# Patient Record
Sex: Male | Born: 1940 | Race: White | Hispanic: No | Smoking: Heavy tobacco smoker
Health system: Southern US, Community
[De-identification: ages and names within clinical notes are randomized; demographics above are authoritative.]

## PROBLEM LIST (undated history)

## (undated) DIAGNOSIS — M79671 Pain in right foot: Secondary | ICD-10-CM

## (undated) DIAGNOSIS — I4891 Unspecified atrial fibrillation: Secondary | ICD-10-CM

## (undated) DIAGNOSIS — R0602 Shortness of breath: Secondary | ICD-10-CM

## (undated) DIAGNOSIS — R29898 Other symptoms and signs involving the musculoskeletal system: Secondary | ICD-10-CM

## (undated) DIAGNOSIS — M79661 Pain in right lower leg: Secondary | ICD-10-CM

## (undated) DIAGNOSIS — L97919 Non-pressure chronic ulcer of unspecified part of right lower leg with unspecified severity: Secondary | ICD-10-CM

## (undated) DIAGNOSIS — M79652 Pain in left thigh: Secondary | ICD-10-CM

## (undated) DIAGNOSIS — M79662 Pain in left lower leg: Secondary | ICD-10-CM

## (undated) DIAGNOSIS — I2510 Atherosclerotic heart disease of native coronary artery without angina pectoris: Secondary | ICD-10-CM

## (undated) DIAGNOSIS — M79651 Pain in right thigh: Secondary | ICD-10-CM

## (undated) DIAGNOSIS — M25559 Pain in unspecified hip: Secondary | ICD-10-CM

## (undated) DIAGNOSIS — I7090 Unspecified atherosclerosis: Secondary | ICD-10-CM

## (undated) DIAGNOSIS — L97929 Non-pressure chronic ulcer of unspecified part of left lower leg with unspecified severity: Secondary | ICD-10-CM

## (undated) DIAGNOSIS — M79672 Pain in left foot: Secondary | ICD-10-CM

## (undated) HISTORY — DX: Pain in right thigh: M79.651

## (undated) HISTORY — DX: Pain in unspecified hip: M25.559

## (undated) HISTORY — DX: Unspecified atherosclerosis: I70.90

## (undated) HISTORY — DX: Pain in left lower leg: M79.662

## (undated) HISTORY — DX: Pain in right foot: M79.671

## (undated) HISTORY — DX: Pain in left thigh: M79.652

## (undated) HISTORY — DX: Non-pressure chronic ulcer of unspecified part of left lower leg with unspecified severity: L97.929

## (undated) HISTORY — DX: Pain in right lower leg: M79.661

## (undated) HISTORY — DX: Other symptoms and signs involving the musculoskeletal system: R29.898

## (undated) HISTORY — DX: Atherosclerotic heart disease of native coronary artery without angina pectoris: I25.10

## (undated) HISTORY — DX: Shortness of breath: R06.02

## (undated) HISTORY — DX: Unspecified atrial fibrillation: I48.91

## (undated) HISTORY — DX: Non-pressure chronic ulcer of unspecified part of right lower leg with unspecified severity: L97.919

## (undated) HISTORY — DX: Pain in left foot: M79.672

---

## 2010-10-20 ENCOUNTER — Telehealth (INDEPENDENT_AMBULATORY_CARE_PROVIDER_SITE_OTHER): Payer: Self-pay | Admitting: *Deleted

## 2010-10-23 ENCOUNTER — Encounter (INDEPENDENT_AMBULATORY_CARE_PROVIDER_SITE_OTHER): Payer: Self-pay | Admitting: *Deleted

## 2010-10-23 ENCOUNTER — Ambulatory Visit: Payer: Self-pay | Admitting: Physician Assistant

## 2010-10-23 DIAGNOSIS — K831 Obstruction of bile duct: Secondary | ICD-10-CM | POA: Insufficient documentation

## 2010-10-24 ENCOUNTER — Encounter: Payer: Self-pay | Admitting: Gastroenterology

## 2010-10-24 ENCOUNTER — Other Ambulatory Visit: Payer: No Typology Code available for payment source | Admitting: Gastroenterology

## 2010-10-24 ENCOUNTER — Ambulatory Visit (HOSPITAL_COMMUNITY): Payer: Medicare Other

## 2010-10-24 ENCOUNTER — Ambulatory Visit (HOSPITAL_COMMUNITY)
Admission: RE | Admit: 2010-10-24 | Discharge: 2010-10-24 | Disposition: A | Discharge status: Home / Self Care | Payer: Medicare Other | Source: Ambulatory Visit | Attending: Gastroenterology | Admitting: Gastroenterology

## 2010-10-24 DIAGNOSIS — I10 Essential (primary) hypertension: Secondary | ICD-10-CM | POA: Insufficient documentation

## 2010-10-24 DIAGNOSIS — K838 Other specified diseases of biliary tract: Secondary | ICD-10-CM | POA: Insufficient documentation

## 2010-10-24 DIAGNOSIS — Y836 Removal of other organ (partial) (total) as the cause of abnormal reaction of the patient, or of later complication, without mention of misadventure at the time of the procedure: Secondary | ICD-10-CM | POA: Insufficient documentation

## 2010-10-24 DIAGNOSIS — R932 Abnormal findings on diagnostic imaging of liver and biliary tract: Secondary | ICD-10-CM

## 2010-10-24 DIAGNOSIS — K929 Disease of digestive system, unspecified: Secondary | ICD-10-CM | POA: Insufficient documentation

## 2010-10-24 DIAGNOSIS — K832 Perforation of bile duct: Secondary | ICD-10-CM

## 2010-11-02 NOTE — Progress Notes (Signed)
Summary: ERCP  Phone Note Outgoing Call   Call placed by: Chales Abrahams CMA Duncan Dull),  October 20, 2010 4:22 PM Summary of Call: pt scheduled for ERCP 10/24/10 unable to reach ENDO I will call on Monday and schedule.  Needs propofol.   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

## 2010-11-02 NOTE — Procedures (Signed)
Summary: ERCP  Patient: Ovadia Lopp Note: All result statuses are Final unless otherwise noted.  Tests: (1) ERCP (ERC)   ERC ERCP                  DONE     [facility]     [address] [place], [place]  [address]          ERCP PROCEDURE REPORT          PATIENT:  Melton, Walls  MR#:  [id number]     BIRTHDATE:  [birth year]  GENDER:  male     ENDOSCOPIST:  Rachael Fee, MD     PROCEDURE DATE:  10/24/2010     Referral MD:  Anson Oregon, MD at [facility]     PROCEDURE:  ERCP with sphincterotomy     INDICATIONS:  bile duct leak following lap chole last week     MEDICATIONS:   MAC sedation, administered by CRNA     TOPICAL ANESTHETIC:  Cetacaine Spray          DESCRIPTION OF PROCEDURE:   After the risks benefits and     alternatives of the procedure were thoroughly explained, informed     consent was obtained.  The  endoscope was introduced through the     mouth and advanced to the second portion of the duodenum without     detailed examination of the UGI tract. Scout film showed clips and     JP drain in RUQ.  A 44 Autotome over a .025 hydrawire was used to     cannulate the CBD. A .035 wire was temporarily placed into main PD     to facilitate biliary cannulation.  Dye was injected,     cholangiogram showed non-dilated CBD without filling defects or     strictures. There was bile leaking from tip of cytic duct stump,     in region of JP drain.  A good biliary sphincterotomy was     performed.  The main pancreatic duct was never injected with dye.     <<PROCEDUREIMAGES>>          Impression:     Bile leaking from tip of cystic duct stump. This was treated with     biliary sphincterotomy whihc is usually just as effective as     biliary stenting to help resolved biliary leak.  No stones or     biliary strictures.          ______________________________     Rachael Fee, MD          n.     eSIGNED:   Rachael Fee at  10/24/2010 10:51 AM          Devoria Glassing, [id number]  Note: An exclamation mark (!) indicates a result that was not dispersed into the flowsheet. Document Creation Date: 10/24/2010 10:52 AM _______________________________________________________________________  (1) Order result status: Final Collection or observation date-time: 10/24/2010 10:42 Requested date-time:  Receipt date-time:  Reported date-time:  Referring Physician:   Ordering Physician: Rob Bunting [phone]) Specimen Source:  Source: Launa Grill Order Number: [id number] Lab site:

## 2010-11-02 NOTE — Letter (Signed)
Summary: EGD Instructions  [facility]  [address] [place], [place] [address]   Phone: [phone]  Fax: [phone]       William Raymond    02-17-1941    MRN: [id number]       Procedure Day /Date:10/24/10 TUE     Arrival Time: 730 am     Procedure Time:930 am     Location of Procedure:                     [facility] ( Outpatient Registration)    PREPARATION FOR ENDOSCOPY   On 10/24/10  THE DAY OF THE PROCEDURE:  Nothing to eat or drink after midnight             OTHER INSTRUCTIONS  You will need a responsible adult at least 70 years of age to accompany you and drive you home.   This person must remain in the waiting room during your procedure.  Wear loose fitting clothing that is easily removed.  Leave jewelry and other valuables at home.  However, you may wish to bring a book to read or an iPod/MP3 player to listen to music as you wait for your procedure to start.  Remove all body piercing jewelry and leave at home.  Total time from sign-in until discharge is approximately 2-3 hours.  You should go home directly after your procedure and rest.  You can resume normal activities the day after your procedure.  The day of your procedure you should not:   Drive   Make legal decisions   Operate machinery   Drink alcohol   Return to work  You will receive specific instructions about eating, activities and medications before you leave.    The above instructions have been reviewed and explained to me by   Chales Abrahams CMA Duncan Dull)  October 23, 2010 9:52 AM     I fully understand and can verbalize these instructions over the phone Date 10/23/10

## 2012-08-24 IMAGING — RF DG ERCP WO/W SPHINCTEROTOMY
5 series · 5 of 5 positions shown · non-contrast
Comparison: None.

CLINICAL DATA: Status post cholecystectomy.  Possible cystic duct
leak.

ERCP with sphincterotomy
TECHNIQUE: Multiple ( five) spot images obtained with the
fluoroscopic device and submitted for interpretation post-
procedure.  ERCP was performed by Dr. Baera.

[Series 1: dr · 1 of 1 slices shown (1 of 5)]
[im 1/1]
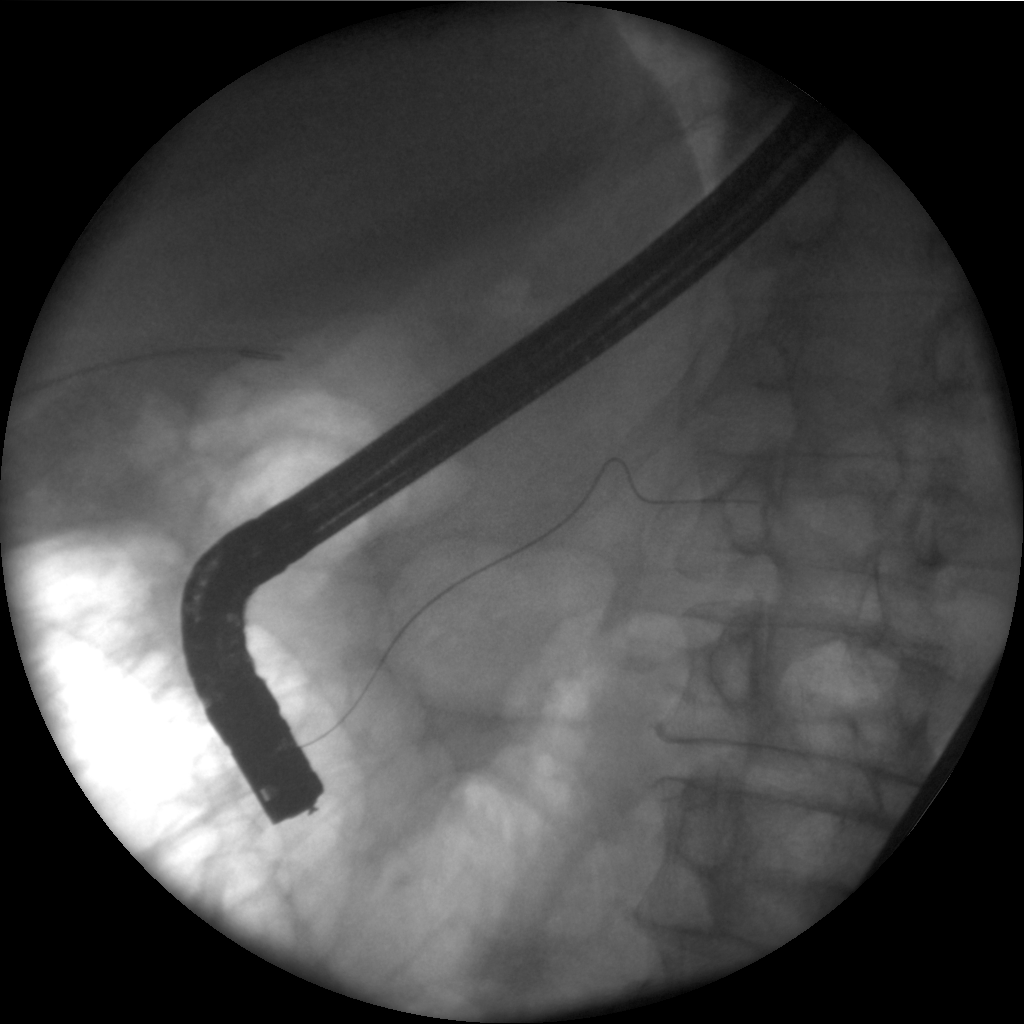

[Series 3: dr · 1 of 1 slices shown (2 of 5)]
[im 1/1]
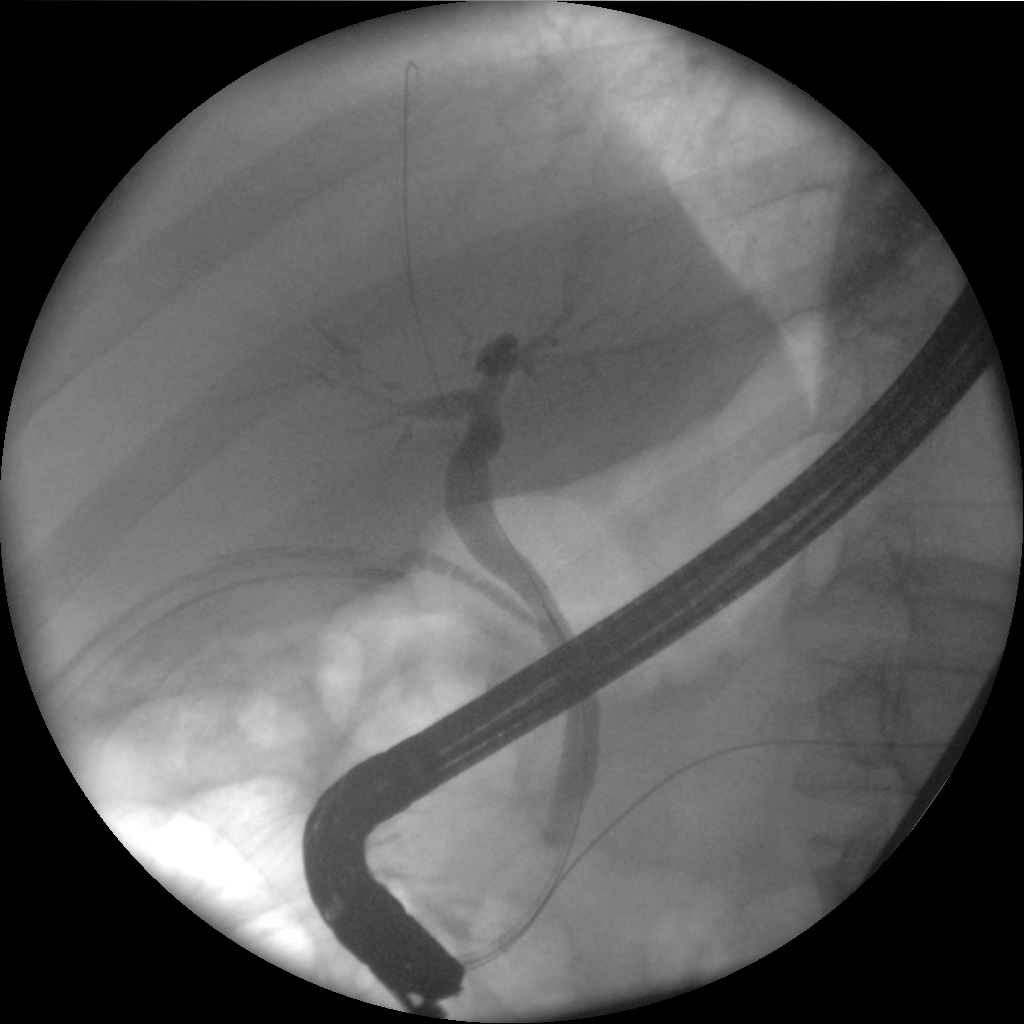

[Series 4: dr · 1 of 1 slices shown (3 of 5)]
[im 1/1]
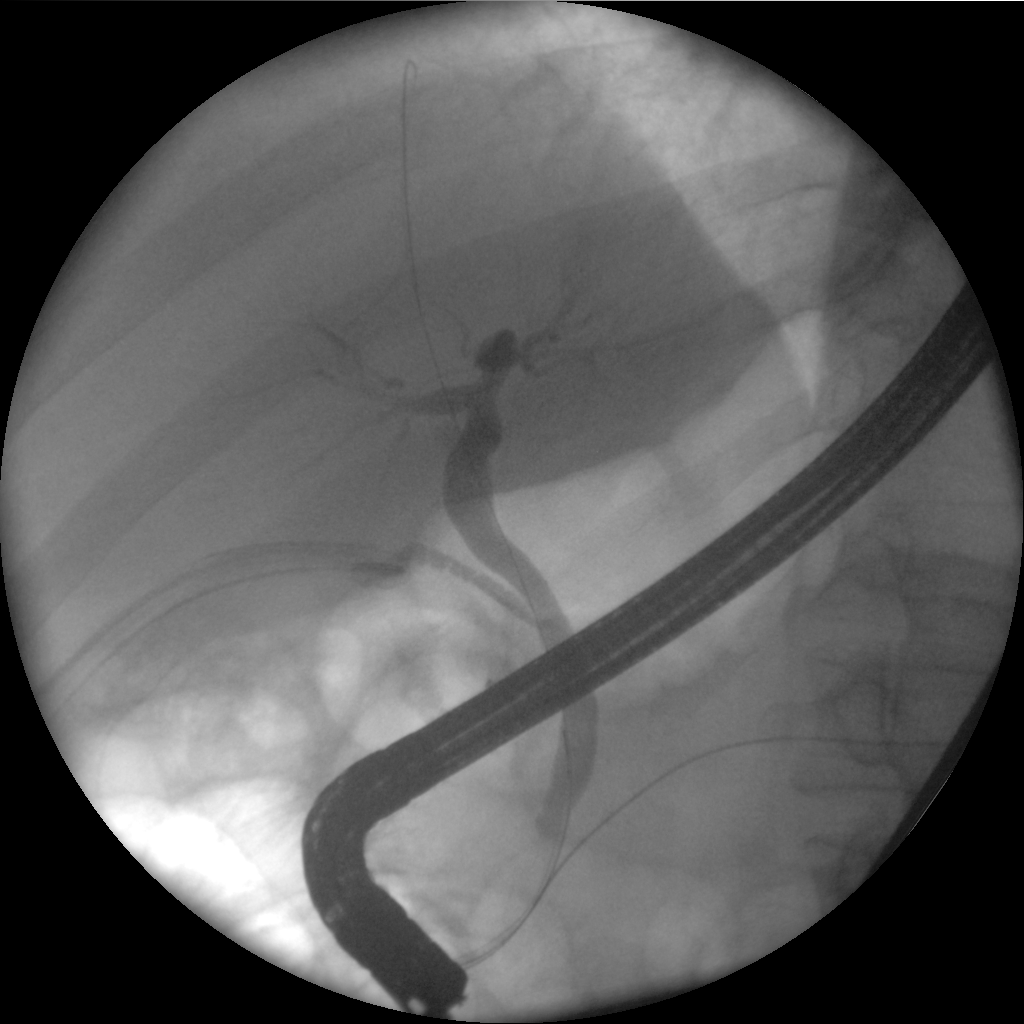

[Series 5: dr · 1 of 1 slices shown (4 of 5)]
[im 1/1]
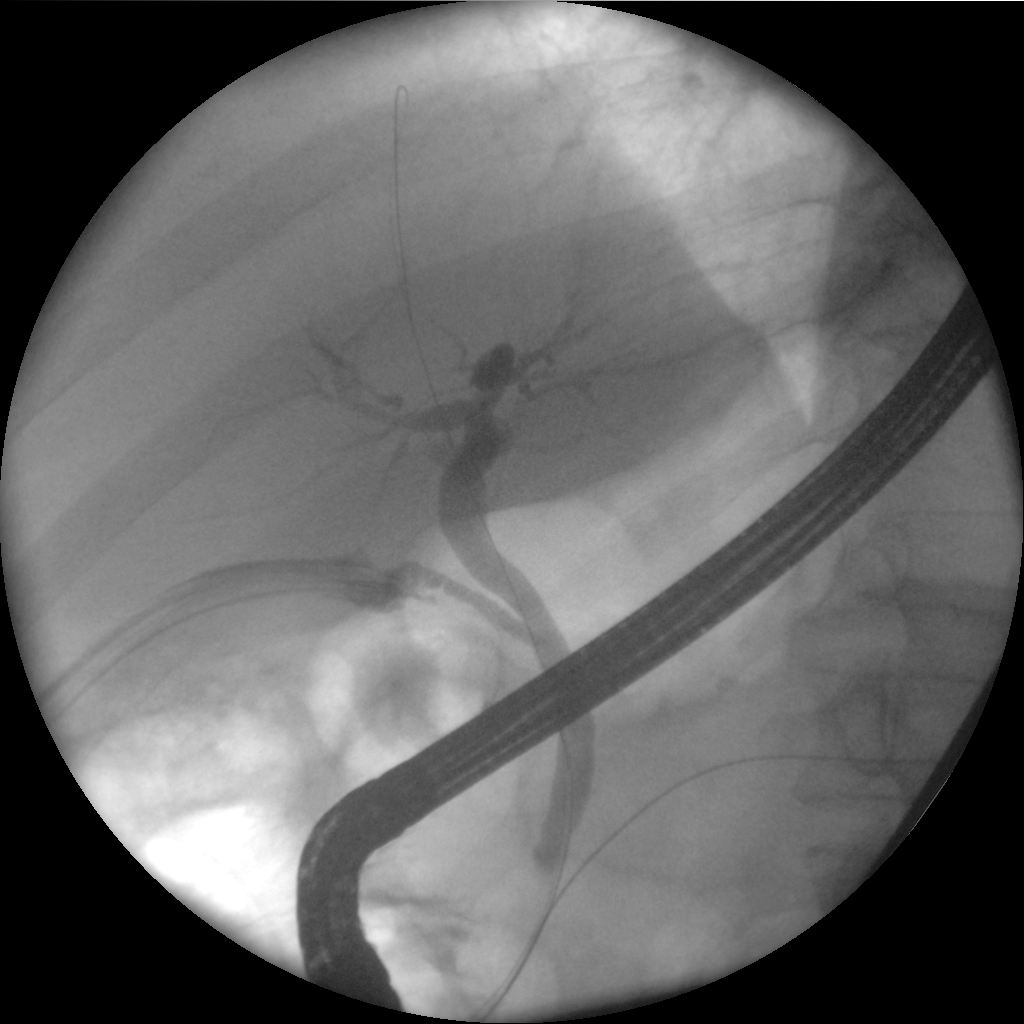

[Series 6: dr · 1 of 1 slices shown (5 of 5)]
[im 1/1]
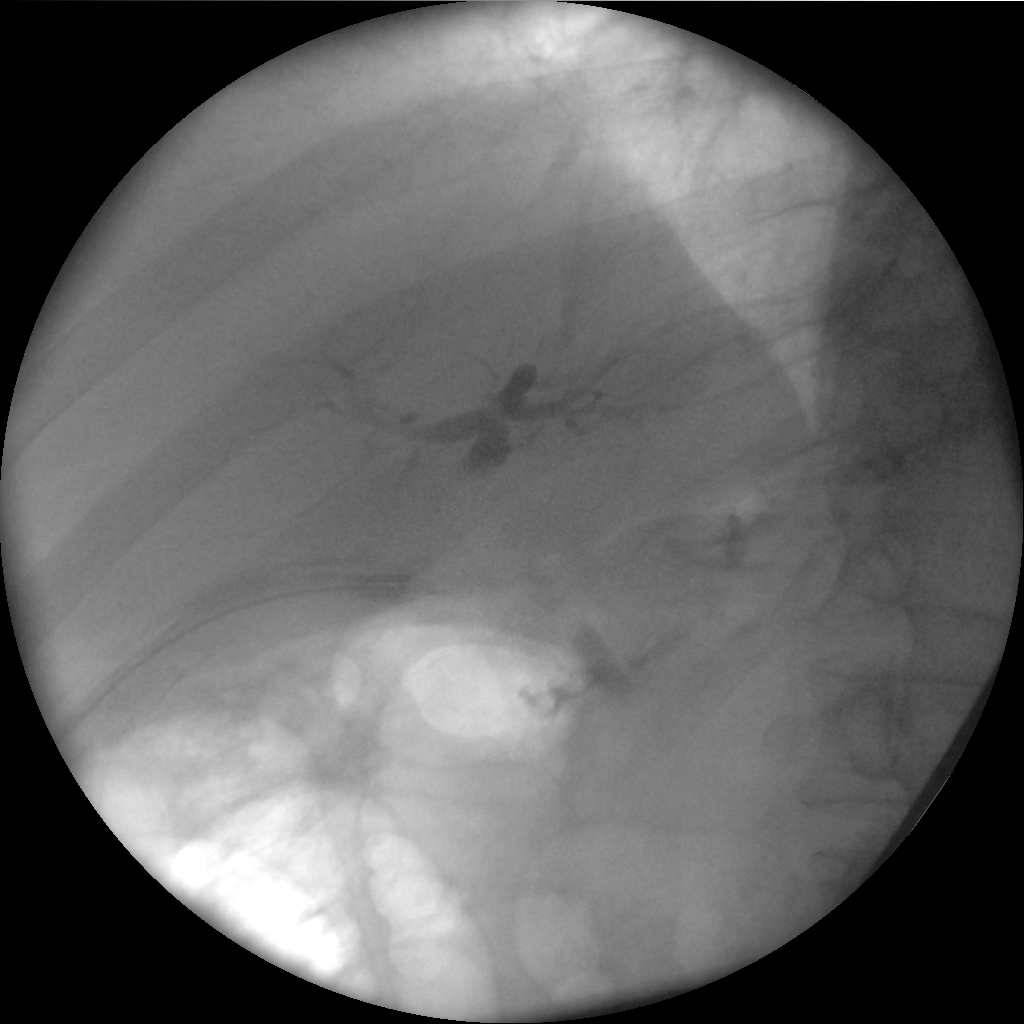

[5 of 5 positions shown; findings below may reference images not displayed]

FINDINGS: The common bile duct is normal in caliber without
definite filling defects.  There is leakage from the cystic duct
remnant with opacification of the right upper quadrant biliary
drain.  A collection of contrast inferior to the cystic duct is
most likely within the the duodenal bulb.  On the final image, the
biliary system appears decompressed.  No definite biliary stent is
seen.
IMPRESSION: Apparent leakage from the cystic duct remnant with contrast
opacification of the surgical biliary drain - correlation with
fluoroscopic impression recommended.

These images were submitted for radiologic interpretation only.
Please see the procedural report for the amount of contrast and the
fluoroscopy time utilized.

## 2018-09-04 ENCOUNTER — Other Ambulatory Visit: Payer: Self-pay

## 2018-09-04 DIAGNOSIS — I731 Thromboangiitis obliterans [Buerger's disease]: Secondary | ICD-10-CM

## 2018-10-01 ENCOUNTER — Other Ambulatory Visit: Payer: Self-pay

## 2018-10-01 ENCOUNTER — Ambulatory Visit: Payer: Medicare Other | Admitting: Vascular Surgery

## 2018-10-01 ENCOUNTER — Ambulatory Visit (HOSPITAL_COMMUNITY)
Admission: RE | Admit: 2018-10-01 | Discharge: 2018-10-01 | Disposition: A | Discharge status: Home / Self Care | Payer: Medicare PPO | Source: Ambulatory Visit | Attending: Vascular Surgery | Admitting: Vascular Surgery

## 2018-10-01 ENCOUNTER — Encounter: Payer: Self-pay | Admitting: Vascular Surgery

## 2018-10-01 VITALS — BP 152/93 | HR 78 | Temp 97.5°F | Resp 20 | Ht 72.0 in | Wt 203.0 lb

## 2018-10-01 DIAGNOSIS — I739 Peripheral vascular disease, unspecified: Secondary | ICD-10-CM | POA: Diagnosis not present

## 2018-10-01 DIAGNOSIS — I731 Thromboangiitis obliterans [Buerger's disease]: Secondary | ICD-10-CM | POA: Diagnosis not present

## 2018-10-01 NOTE — Progress Notes (Signed)
REASON FOR CONSULT:    Buerger's disease.  The consult is requested by Dr. Celine Mans.  ASSESSMENT & PLAN:   PERIPHERAL VASCULAR DISEASE: I do not have any information to confirm that he has Buerger's disease.  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  Sounds like this was atherectomy and balloon angioplasty.  I do not have images from below the knee but I suspect he had tibial disease and perhaps this is where the diagnosis of Buerger's disease came from.  Regardless, he describes some weakness in his legs but no clear-cut claudication or rest pain.  He has palpable femoral and popliteal pulses with multiphasic Doppler signals in both feet and very reasonable toe pressures.  Thus I think he does have some underlying infrainguinal arterial occlusive disease but I do not think this necessarily explains his symptoms.  We have had a long discussion about the importance of tobacco cessation. (> ).  I explained that clearly this contributes to his underlying atherosclerosis but also causes vasospasm.  I have recommended a follow-up arterial Doppler study in 6 months and I will see him back at this time.  I encouraged him to stay as active as possible.  Certainly if his symptoms progress we could consider arteriography.  However, with multiphasic signals in both feet and very reasonable toe pressures I really do not think that this is necessary at this point.  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  CHRONIC VENOUS INSUFFICIENCY: Based on his exam he clearly has evidence of significant chronic venous insufficiency.  He has undergone endovenous laser ablation of the saphenous veins bilaterally subsequent to his arteriogram in 2017 and 2018.  Because of his underlying arterial insufficiency he will have to be  careful with compression.  I certainly would not recommend very aggressive compression given his tibial artery occlusive disease.   Waverly Ferrari, MD, FACS Beeper [phone] Office: [phone]   HPI:   William Raymond is a pleasant 78 y.o. male, who comes I believe for a second opinion.  He had been followed by his cardiologist in [place] and is undergone multiple procedures.  In 2018 in November and also again in February he apparently underwent atherectomy and angioplasty of the superficial femoral arteries bilaterally.  The patient's son showed me images to confirm this.  The images only demonstrate the superficial femoral artery.  I do not see any distal images.  Subsequent to that he has had endovenous laser ablation of both great saphenous veins.  He describes pain in his lower legs which occur when he is walking up steps and also he states that his legs feel weak.  He does have chronic low back pain.  I do not get symptoms consistent with classic claudication.  He denies any history of rest pain or nonhealing ulcers.  He is unaware of any previous history of DVT or phlebitis.  His risk factors for peripheral vascular disease include hypertension, hypercholesterolemia, and heavy tobacco use.  He currently smokes 2 packs/day and has been smoking for 63 years.  He is on Coumadin for atrial fibrillation.  Past Medical History:  Diagnosis Date  . A-fib (HCC)   . Arm weakness    Sudden  . Bilateral calf pain   . Bilateral thigh pain   . Blocked artery   . CAD (coronary artery disease)   .  Foot pain, bilateral    that wakes him up  . Hip pain    bilat  . Leg weakness    Sudden  . SOB (shortness of breath)   . Ulcers of both lower extremities (HCC)     No family history on file.  SOCIAL HISTORY: Social History   Socioeconomic History  . Marital status: Widowed    Spouse name: Not on file  . Number of children: Not on file  . Years of education: Not on file  . Highest  education level: Not on file  Occupational History  . Not on file  Social Needs  . Financial resource strain: Not on file  . Food insecurity:    Worry: Not on file    Inability: Not on file  . Transportation needs:    Medical: Not on file    Non-medical: Not on file  Tobacco Use  . Smoking status: Heavy Tobacco Smoker    Packs/day: 2.00  . Smokeless tobacco: Never Used  Substance and Sexual Activity  . Alcohol use: Not on file  . Drug use: Not on file  . Sexual activity: Not on file  Lifestyle  . Physical activity:    Days per week: Not on file    Minutes per session: Not on file  . Stress: Not on file  Relationships  . Social connections:    Talks on phone: Not on file    Gets together: Not on file    Attends religious service: Not on file    Active member of club or organization: Not on file    Attends meetings of clubs or organizations: Not on file    Relationship status: Not on file  . Intimate partner violence:    Fear of current or ex partner: Not on file    Emotionally abused: Not on file    Physically abused: Not on file    Forced sexual activity: Not on file  Other Topics Concern  . Not on file  Social History Narrative  . Not on file    No Known Allergies  Current Outpatient Medications  Medication Sig Dispense Refill  . atorvastatin (LIPITOR) 10 MG tablet TK 1 T PO QD    . buPROPion (WELLBUTRIN SR) 150 MG 12 hr tablet     . cilostazol (PLETAL) 100 MG tablet     . gabapentin (NEURONTIN) 100 MG capsule     . levothyroxine (SYNTHROID, LEVOTHROID) 25 MCG tablet     . losartan (COZAAR) 50 MG tablet     . metoprolol tartrate (LOPRESSOR) 50 MG tablet     . warfarin (COUMADIN) 5 MG tablet      No current facility-administered medications for this visit.     REVIEW OF SYSTEMS:  [X]  denotes positive finding, [ ]  denotes negative finding Cardiac  Comments:  Chest pain or chest pressure:    Shortness of breath upon exertion: x   Short of breath when  lying flat:    Irregular heart rhythm:        Vascular    Pain in calf, thigh, or hip brought on by ambulation: x   Pain in feet at night that wakes you up from your sleep:  x   Blood clot in your veins:    Leg swelling:         Pulmonary    Oxygen at home:    Productive cough:     Wheezing:         Neurologic  Sudden weakness in arms or legs:  x   Sudden numbness in arms or legs:     Sudden onset of difficulty speaking or slurred speech:    Temporary loss of vision in one eye:     Problems with dizziness:         Gastrointestinal    Blood in stool:     Vomited blood:         Genitourinary    Burning when urinating:     Blood in urine:        Psychiatric    Major depression:         Hematologic    Bleeding problems:    Problems with blood clotting too easily:        Skin    Rashes or ulcers: x       Constitutional    Fever or chills:     PHYSICAL EXAM:   Vitals:   10/01/18 1206  BP: (!) 152/93  Pulse: 78  Resp: 20  Temp: (!) 97.5 F (36.4 C)  TempSrc: Oral  SpO2: 98%  Weight: 203 lb 0.7 oz (92.1 kg)  Height: 6' (1.829 m)    GENERAL: The patient is a well-nourished male, in no acute distress. The vital signs are documented above. CARDIAC: There is a regular rate and rhythm.  VASCULAR: I do not detect carotid bruits. He has palpable femoral and popliteal pulses bilaterally. He has venous hypertension bilaterally with bluish discoloration of his feet.  He has telangiectasias spider veins and varicose veins bilaterally. PULMONARY: There is good air exchange bilaterally without wheezing or rales. ABDOMEN: Soft and non-tender with normal pitched bowel sounds.  MUSCULOSKELETAL: There are no major deformities or cyanosis. NEUROLOGIC: No focal weakness or paresthesias are detected. SKIN: There are no ulcers or rashes noted. PSYCHIATRIC: The patient has a normal affect.  DATA:    ARTERIAL DOPPLER STUDY: I have independently interpreted his arterial  Doppler study today.  On the right side there is a triphasic dorsalis pedis and posterior tibial signal.  ABIs 100%.  Toe pressure is 97 mmHg.  On the left side there is a biphasic dorsalis pedis and posterior tibial signal.  ABIs 100%.  Toe pressure is 77 mmHg.

## 2019-01-21 ENCOUNTER — Emergency Department (HOSPITAL_COMMUNITY)
Admission: EM | Admit: 2019-01-21 | Discharge: 2019-02-04 | Disposition: E | Payer: Medicare PPO | Attending: Emergency Medicine | Admitting: Emergency Medicine

## 2019-01-21 ENCOUNTER — Other Ambulatory Visit: Payer: Self-pay

## 2019-01-21 DIAGNOSIS — K922 Gastrointestinal hemorrhage, unspecified: Secondary | ICD-10-CM | POA: Insufficient documentation

## 2019-01-21 DIAGNOSIS — F17200 Nicotine dependence, unspecified, uncomplicated: Secondary | ICD-10-CM | POA: Insufficient documentation

## 2019-01-21 DIAGNOSIS — I469 Cardiac arrest, cause unspecified: Secondary | ICD-10-CM | POA: Diagnosis present

## 2019-01-21 DIAGNOSIS — Z79899 Other long term (current) drug therapy: Secondary | ICD-10-CM | POA: Insufficient documentation

## 2019-01-21 DIAGNOSIS — I2510 Atherosclerotic heart disease of native coronary artery without angina pectoris: Secondary | ICD-10-CM | POA: Diagnosis not present

## 2019-01-21 MED ORDER — EPINEPHRINE PF 1 MG/ML IJ SOLN
1.0000 mg | Freq: Once | INTRAMUSCULAR | Status: AC
  Administered 2019-01-21: 1 mg via INTRAVENOUS

## 2019-01-21 MED ORDER — SODIUM BICARBONATE 8.4 % IV SOLN
50.0000 meq | Freq: Once | INTRAVENOUS | Status: AC
  Administered 2019-01-21: 04:00:00 50 meq via INTRAVENOUS

## 2019-01-21 MED ORDER — SODIUM BICARBONATE 8.4 % IV SOLN
50.0000 meq | Freq: Once | INTRAVENOUS | Status: AC
  Administered 2019-01-21: 50 meq via INTRAVENOUS

## 2019-01-21 MED ORDER — NOREPINEPHRINE 4 MG/250ML-% IV SOLN
INTRAVENOUS | Status: AC
  Administered 2019-01-21: 04:00:00
  Filled 2019-01-21: qty 250

## 2019-01-21 MED ORDER — CALCIUM CHLORIDE 10 % IV SOLN
1.0000 g | Freq: Once | INTRAVENOUS | Status: AC
  Administered 2019-01-21: 1 g via INTRAVENOUS

## 2019-01-21 MED ORDER — VASOPRESSIN 20 UNIT/ML IV SOLN
100.0000 [IU] | INTRAVENOUS | Status: DC
  Filled 2019-01-21: qty 5

## 2019-01-21 MED ORDER — VASOPRESSIN 20 UNIT/ML IV SOLN
INTRAVENOUS | Status: AC
  Filled 2019-01-21: qty 5

## 2019-01-28 MED FILL — Medication: Qty: 1 | Status: AC

## 2019-02-04 NOTE — ED Notes (Signed)
2 units of blood from [facility] lab not given. Units returned to the lab.

## 2019-02-04 NOTE — ED Triage Notes (Signed)
Patient brought in by [facility] care ground unit en-route for a transfer to [place]. Patient went into cardiac arrest while air care was en-route. Patient diagnosed esophageal tear and has a hx of alcohol use. CPR. Patient given 1 whole blood, 4 liters of NS fluid, 30 of Levo, .4 of vaso. Patient has 14 ga. In EJ and bilateral 18 ga for IV access. Patient went into V-fib  And PEA. Air Care gave 2 EPI BEFORE ARRIVAL.

## 2019-02-04 NOTE — ED Notes (Signed)
CPR in progress. Patient had continuous CPR with no changes in progress. Patient was intubated prior to arrival.

## 2019-02-04 NOTE — ED Provider Notes (Addendum)
Emergency Department Provider Note   I have reviewed the triage vital signs and the nursing notes.   HISTORY  Chief Complaint Cardiac Arrest   HPI William Raymond is a 78 y.o. male who presents to the emergency department via [facility] health air care critical care transport team in cardiac arrest.  The full history was obtained from the transport team.  Apparently the patient was at [facility] and had a bedside EGD was found to have a large blood clot in his esophagus.  He was already intubated at that time.  Had received 4 units of packed red blood cells, 2 units of FFP, TXA, vasopressin, Levophed, octreotide, Protonix and broad-spectrum antibiotics between the other hospital and air care.  In route to [facility] the patient had a PEA arrest.  Patient was given epinephrine and chest compressions were started.  He was found to be in ventricular fibrillation and was shocked to no avail.  Patient had large amount of blood coming from his mouth even while intubated and also in his ET tube.  His abdomen became more distended.  We were the closest emergency department today presented here for an attempt at stabilization to continue the transfer to [facility] for definitive care.  LEVEL V CAVEAT APPLIES SECONDARY TO intubated and cardiac arrest   Past Medical History:  Diagnosis Date  . A-fib ([place])   . Arm weakness    Sudden  . Bilateral calf pain   . Bilateral thigh pain   . Blocked artery   . CAD (coronary artery disease)   . Foot pain, bilateral    that wakes him up  . Hip pain    bilat  . Leg weakness    Sudden  . SOB (shortness of breath)   . Ulcers of both lower extremities [facility])     Patient Active Problem List   Diagnosis Date Noted  . OBSTRUCTION OF BILE DUCT 10/23/2010    No past surgical history on file.  Current Outpatient Rx  . Order #: [id number] Class: Historical Med  . Order #: [id number] Class: Historical Med  . Order #: [id number] Class: Historical Med   . Order #: [id number] Class: Historical Med  . Order #: [id number] Class: Historical Med  . Order #: [id number] Class: Historical Med  . Order #: [id number] Class: Historical Med  . Order #: [id number] Class: Historical Med    Allergies Patient has no known allergies.  No family history on file.  Social History Social History   Tobacco Use  . Smoking status: Heavy Tobacco Smoker    Packs/day: 2.00  . Smokeless tobacco: Never Used  Substance Use Topics  . Alcohol use: Not on file  . Drug use: Not on file    Review of Systems  LEVEL V CAVEAT APPLIES SECONDARY TO intubated and cardiac arrest ____________________________________________  PHYSICAL EXAM:  VITAL SIGNS: ED Triage Vitals  Enc Vitals Group     BP --      Pulse Rate 02-04-2019 0351 83     Resp February 04, 2019 0351 12     Temp --      Temp src --      SpO2 --      Weight 04-Feb-2019 0355 200 lb 9.9 oz (91 kg)     Height 2019/02/04 0355 5\' 7"  (1.702 m)    Constitutional: Ill appearing. Endotracheally intubated.  Eyes: Conjunctivae are pale. Equal, nonreactive Pupils.  Head: Atraumatic. Nose: blood from bilateral nares Mouth/Throat: intubated with blood in ETT and coming around it Neck: Not able  to fully assess Cardiovascular: No rate, No rhythm.  Respiratory: No respiratory effort.  Lungs bilateral diffuse rales Gastrointestinal: Significantly tight and distended.  Musculoskeletal: No lower extremity tenderness nor edema. No gross deformities of extremities. Neurologic:  Not able to assess 2/2 condition.  Skin:   No rash noted. Psych: Not able to assess 2/2 condition.   ____________________________________________   LABS (all labs ordered are listed, but only abnormal results are displayed)  Labs Reviewed  TYPE AND SCREEN   ____________________________________________  EKG   EKG Interpretation  Date/Time:    Ventricular Rate:    PR Interval:    QRS Duration:   QT Interval:    QTC Calculation:   R Axis:     Text  Interpretation:         ____________________________________________  RADIOLOGY  No results found. ____________________________________________   PROCEDURES  Procedure(s) performed:   .Critical Care Performed by: Marily MemosMesner, Aberdeen Hafen, MD Authorized by: Marily MemosMesner, Aydrian Halpin, MD   Critical care provider statement:    Critical care time (minutes):  31   Critical care was time spent personally by me on the following activities:  Discussions with consultants, evaluation of patient's response to treatment, examination of patient, ordering and performing treatments and interventions, ordering and review of laboratory studies, ordering and review of radiographic studies, pulse oximetry, re-evaluation of patient's condition, obtaining history from patient or surrogate and review of old charts   Cardiopulmonary Resuscitation (CPR) Procedure Note Directed/Performed by: Marily MemosJason Hezikiah Retzloff I personally directed ancillary staff and/or performed CPR in an effort to regain return of spontaneous circulation and to maintain cardiac, neuro and systemic perfusion.   ____________________________________________   INITIAL IMPRESSION / ASSESSMENT AND PLAN / ED COURSE  Pertinent labs & imaging results that were available during my care of the patient were reviewed by me and considered in my medical decision making (see chart for details).  Prior to patient arriving, after an code from EMS, I discussed with charge nurse and [facility] to try to find a Blakemore tube but both stated they did not think we had one in the hospital.  If any where they thought they might be able to find one in the operating room and she started to search for. Patient presented to the emergency room secondary to cardiac arrest during transport.  Had multiple units of blood still had a PEA arrest.  Gave multiple more units of blood, FFP, calcium, bicarb, epinephrine and a new bag of Levophed and vasopressin in the emergency room.  Subsequently, patient  had approximately 7 to 10-minute return of spontaneous circulation (during which time I spoke with his daughter, Olegario MessierKathy, who was his next of [facility] and according to her and updated her on the situation.  She stated that she would not want to make him DNR however if I did not feel there is anything else to do he would not want us to continue prolonged life support) but then once again lost pulses.  I [facility] with [facility] during this time who confirmed that we do not have a Blakemore tube or any other such thing in the hospital.   Repeated all measures however patient continued to have pulselessness after approximate 12 to 15 minutes of further resuscitation.  As treatment had already been given for variceal bleed, esophageal bleed, hyperkalemia, acidosis I feel like the patient's most likely reason for persistent cardiac arrest is likely esophageal hemorrhage into his persistently distended abdomen.  I do not have cardiothoracic surgery on-call here and I  do not have access to any life saving or stabilizing treatments and [facility] and nursing and myself I do not feel as much else that we can do to prolong this patient's life long enough to get definitive treatment as he is also likely in DIC.  Patient was confirmed pulseless with absent respiratory activity and declared dead at 0406. His daughter was updated via telephone and all questions answered. Not an ME case.   ____________________________________________  FINAL CLINICAL IMPRESSION(S) / ED DIAGNOSES  Final diagnoses:  Gastrointestinal hemorrhage, unspecified gastrointestinal hemorrhage type  Cardiac arrest (HCC)    MEDICATIONS GIVEN DURING THIS VISIT:  Medications  vasopressin (PITRESSIN) 20 UNIT/ML injection 100 Units (100 Units Intravenous Not Given Dec 10, 2018 0429)  norepinephrine (LEVOPHED) 4-5 MG/250ML-% infusion SOLN (  Stopped Dec 10, 2018 0430)  sodium bicarbonate injection 50 mEq (50 mEq  Intravenous Given Dec 10, 2018 0358)  EPINEPHrine (ADRENALIN) 1 mg (1 mg Intravenous Given Dec 10, 2018 0358)  EPINEPHrine (ADRENALIN) 1 mg (1 mg Intravenous Given Dec 10, 2018 0403)  sodium bicarbonate injection 50 mEq (50 mEq Intravenous Given Dec 10, 2018 0344)  EPINEPHrine (ADRENALIN) 1 mg (1 mg Intravenous Given Dec 10, 2018 0344)  calcium chloride injection 1 g (1 g Intravenous Given Dec 10, 2018 0345)  vasopressin (PITRESSIN) 20 UNIT/ML injection (has no administration in time range)    NEW OUTPATIENT MEDICATIONS STARTED DURING THIS VISIT:  New Prescriptions   No medications on file    Note:  This document was prepared using Dragon voice recognition software and may include unintentional dictation errors.    Lilinoe Acklin, Barbara CowerJason, MD 0May 06, 2020 [id number]    Marily MemosMesner, Anayah Arvanitis, MD 0May 06, 2020 [phone]

## 2019-02-04 NOTE — ED Notes (Signed)
Pulse check at 03:47 CPR/ no pulse CPR continued.

## 2019-02-04 NOTE — ED Notes (Signed)
Dr Dayna Barker pronounced patient time of death at 04:06 after continuous CPR and medication administration.

## 2019-02-04 DEATH — deceased

## 2019-05-27 LAB — TYPE AND SCREEN
Unit division: 0
Unit division: 0

## 2019-05-27 LAB — BPAM RBC
Blood Product Expiration Date: 202007082359
Blood Product Expiration Date: 202007082359
ISSUE DATE / TIME: 202006220637
ISSUE DATE / TIME: 202006240034
Unit Type and Rh: 9500
Unit Type and Rh: 9500
